# Patient Record
Sex: Male | Born: 1985 | Race: Black or African American | Hispanic: No | Marital: Single | Smoking: Current every day smoker
Health system: Southern US, Community
[De-identification: ages and names within clinical notes are randomized; demographics above are authoritative.]

## PROBLEM LIST (undated history)

## (undated) DIAGNOSIS — I10 Essential (primary) hypertension: Secondary | ICD-10-CM

## (undated) HISTORY — DX: Essential (primary) hypertension: I10

---

## 2011-04-17 ENCOUNTER — Emergency Department (HOSPITAL_BASED_OUTPATIENT_CLINIC_OR_DEPARTMENT_OTHER)
Admission: EM | Admit: 2011-04-17 | Discharge: 2011-04-17 | Disposition: A | Discharge status: Home / Self Care | Payer: Self-pay | Attending: Emergency Medicine | Admitting: Emergency Medicine

## 2011-04-17 DIAGNOSIS — Z202 Contact with and (suspected) exposure to infections with a predominantly sexual mode of transmission: Secondary | ICD-10-CM | POA: Insufficient documentation

## 2011-04-17 DIAGNOSIS — N342 Other urethritis: Secondary | ICD-10-CM | POA: Insufficient documentation

## 2011-04-17 DIAGNOSIS — F17200 Nicotine dependence, unspecified, uncomplicated: Secondary | ICD-10-CM | POA: Insufficient documentation

## 2011-04-17 MED ORDER — CEFTRIAXONE SODIUM 250 MG IJ SOLR
250.0000 mg | Freq: Once | INTRAMUSCULAR | Status: AC
  Administered 2011-04-17: 250 mg via INTRAMUSCULAR
  Filled 2011-04-17: qty 250

## 2011-04-17 MED ORDER — AZITHROMYCIN 1 G PO PACK
1.0000 g | PACK | Freq: Once | ORAL | Status: AC
  Administered 2011-04-17: 1 g via ORAL
  Filled 2011-04-17: qty 1

## 2011-04-17 NOTE — ED Notes (Signed)
Pt denies c/o-girlfriend recently tx for BV with cont'd d/c

## 2011-04-17 NOTE — ED Provider Notes (Signed)
History     CSN: [id number]  Arrival date & time 04/17/11  1803   First MD Initiated Contact with Patient 04/17/11 1917      Chief Complaint  Patient presents with  . Exposure to STD    (Consider location/radiation/quality/duration/timing/severity/associated sxs/prior treatment) Patient is a 25 y.o. male presenting with STD exposure. The history is provided by the patient. No language interpreter was used.  Exposure to STD This is a new problem. The current episode started today. The problem occurs constantly. The problem has been unchanged. The symptoms are aggravated by nothing. He has tried nothing for the symptoms.  Pt reports girlfriend has a discharge.  Pt request test for std  History reviewed. No pertinent past medical history.  History reviewed. No pertinent past surgical history.  No family history on file.  History  Substance Use Topics  . Smoking status: Current Everyday Smoker  . Smokeless tobacco: Not on file  . Alcohol Use: Yes      Review of Systems  Genitourinary: Negative for urgency, decreased urine volume, discharge, penile swelling and penile pain.  All other systems reviewed and are negative.    Allergies  Review of patient's allergies indicates not on file.  Home Medications  No current outpatient prescriptions on file.  BP 133/65  Pulse 88  Temp(Src) 98.6 F (37 C) (Oral)  Resp 16  Ht 6\' 1"  (1.854 m)  Wt 165 lb (74.844 kg)  BMI 21.77 kg/m2  SpO2 100%  Physical Exam  Nursing note and vitals reviewed. Constitutional: He appears well-developed and well-nourished.  HENT:  Head: Normocephalic.  Eyes: Pupils are equal, round, and reactive to light.  Neck: Normal range of motion.  Cardiovascular: Normal rate and normal heart sounds.   Pulmonary/Chest: Effort normal.  Abdominal: Soft.  Genitourinary: Penis normal. No penile tenderness.  Neurological: He is alert.  Skin: Skin is warm.    ED Course  Procedures (including critical  care time)   Labs Reviewed  GC/CHLAMYDIA PROBE AMP, GENITAL   No results found.   No diagnosis found.    MDM  Pt request treatment        Langston Masker, [place] 04/17/11 1919

## 2011-04-18 NOTE — ED Provider Notes (Signed)
Medical screening examination/treatment/procedure(s) were performed by non-physician practitioner and as supervising physician I was immediately available for consultation/collaboration.  Kenleigh Toback R. Paschal Blanton, MD 04/18/11 0044 

## 2011-04-19 LAB — GC/CHLAMYDIA PROBE AMP, GENITAL: Chlamydia, DNA Probe: NEGATIVE

## 2011-10-07 ENCOUNTER — Emergency Department (HOSPITAL_BASED_OUTPATIENT_CLINIC_OR_DEPARTMENT_OTHER)
Admission: EM | Admit: 2011-10-07 | Discharge: 2011-10-07 | Disposition: A | Discharge status: Home / Self Care | Payer: Self-pay | Attending: Emergency Medicine | Admitting: Emergency Medicine

## 2011-10-07 ENCOUNTER — Encounter (HOSPITAL_BASED_OUTPATIENT_CLINIC_OR_DEPARTMENT_OTHER): Payer: Self-pay | Admitting: *Deleted

## 2011-10-07 ENCOUNTER — Emergency Department (HOSPITAL_BASED_OUTPATIENT_CLINIC_OR_DEPARTMENT_OTHER): Payer: Self-pay

## 2011-10-07 DIAGNOSIS — F17200 Nicotine dependence, unspecified, uncomplicated: Secondary | ICD-10-CM | POA: Insufficient documentation

## 2011-10-07 DIAGNOSIS — Y998 Other external cause status: Secondary | ICD-10-CM | POA: Insufficient documentation

## 2011-10-07 DIAGNOSIS — W19XXXA Unspecified fall, initial encounter: Secondary | ICD-10-CM | POA: Insufficient documentation

## 2011-10-07 DIAGNOSIS — S62111A Displaced fracture of triquetrum [cuneiform] bone, right wrist, initial encounter for closed fracture: Secondary | ICD-10-CM

## 2011-10-07 DIAGNOSIS — S62113A Displaced fracture of triquetrum [cuneiform] bone, unspecified wrist, initial encounter for closed fracture: Secondary | ICD-10-CM | POA: Insufficient documentation

## 2011-10-07 DIAGNOSIS — Y9383 Activity, rough housing and horseplay: Secondary | ICD-10-CM | POA: Insufficient documentation

## 2011-10-07 MED ORDER — HYDROCODONE-ACETAMINOPHEN 5-500 MG PO TABS: 1.0000 | ORAL_TABLET | Freq: Four times a day (QID) | ORAL | Status: AC | PRN

## 2011-10-07 NOTE — Discharge Instructions (Signed)
Hand Fracture Your caregiver has diagnosed you with a fractured (broken) bone in your hand. If the bones are in good position and the hand is properly immobilized and rested, these injuries will usually heal in 3 to 6 weeks. A cast, splint, or bulky bandage is usually applied to keep the fracture site from moving. Do not remove the splint or cast until your caregiver approves. If the fracture is unstable or the bones are not aligned properly, surgery may be needed. Keep your hand raised (elevated) above the level of your heart as much as possible for the next 2 to 3 days until the swelling and pain are better. Apply ice packs for 15 to 20 minutes every 3 to 4 hours to help control the pain and swelling. See your caregiver or an orthopedic specialist as directed for follow-up care to make sure the fracture is beginning to heal properly. SEEK IMMEDIATE MEDICAL CARE IF:   You notice your fingers are cold, numb, crooked, or the pain of your injury is severe.   You are not improving or seem to be getting worse.   You have questions or concerns.  Document Released: 05/15/2004 Document Revised: 03/27/2011 Document Reviewed: 10/03/2008 Exit[employer] Patient Information 2012 [employer] 

## 2011-10-07 NOTE — ED Provider Notes (Addendum)
History     CSN: [id number]  Arrival date & time 10/07/11  1752   First MD Initiated Contact with Patient 10/07/11 1757      Chief Complaint  Patient presents with  . Hand Pain    (Consider location/radiation/quality/duration/timing/severity/associated sxs/prior treatment) HPI Comments: Pain is 7/10 and worse in the palm and lateral hand  Patient is a 26 y.o. male presenting with hand pain. The history is provided by the patient.  Hand Pain This is a new (was playing with the dog and fell on the concrete on his right hand) problem. The current episode started 1 to 2 hours ago. The problem occurs constantly. The problem has not changed since onset.The symptoms are aggravated by bending and twisting. Nothing relieves the symptoms. He has tried acetaminophen for the symptoms. The treatment provided no relief.    History reviewed. No pertinent past medical history.  History reviewed. No pertinent past surgical history.  History reviewed. No pertinent family history.  History  Substance Use Topics  . Smoking status: Current Everyday Smoker  . Smokeless tobacco: Not on file  . Alcohol Use: Yes      Review of Systems  Constitutional: Negative for fever and chills.  Neurological: Negative for weakness and numbness.  All other systems reviewed and are negative.    Allergies  Review of patient's allergies indicates no known allergies.  Home Medications  No current outpatient prescriptions on file.  BP 136/90  Pulse 76  Temp 98.3 F (36.8 C) (Oral)  Resp 20  Ht 6\' 2"  (1.88 m)  Wt 166 lb (75.297 kg)  BMI 21.31 kg/m2  SpO2 99%  Physical Exam  Nursing note and vitals reviewed. Constitutional: He is oriented to person, place, and time. He appears well-developed and well-nourished. No distress.  HENT:  Head: Normocephalic and atraumatic.  Mouth/Throat: Oropharynx is clear and moist.  Eyes: Conjunctivae and EOM are normal. Pupils are equal, round, and reactive to  light.  Musculoskeletal: He exhibits tenderness. He exhibits no edema.       Right elbow: Normal.      Right hand: He exhibits decreased range of motion, tenderness, bony tenderness and swelling. He exhibits normal capillary refill, no deformity and no laceration. normal sensation noted. Normal strength noted.       Hands:      Right snuffbox tenderness  Neurological: He is alert and oriented to person, place, and time.  Skin: Skin is warm and dry. No rash noted. No erythema.  Psychiatric: He has a normal mood and affect. His behavior is normal.    ED Course  Procedures (including critical care time)  Labs Reviewed - No data to display Dg Wrist Complete Right  10/07/2011  *RADIOLOGY REPORT*  Clinical Data: Fall  RIGHT WRIST - COMPLETE 3+ VIEW  Comparison: None.  Findings: On the lateral view, there is cortical irregularity dorsally.  This may be due to bony overlap or possibly a fracture of the triquetrum.  Clinical evaluation of this area suggested. This cannot be confirmed on other views.  No other fracture or arthropathy.  IMPRESSION: Possible fracture versus bony overlap of the dorsal wrist.  Original Report Authenticated By: Camelia Phenes, M.D.   Dg Hand Complete Right  10/07/2011  *RADIOLOGY REPORT*  Clinical Data: Fall  RIGHT HAND - COMPLETE 3+ VIEW  Comparison: None.  Findings: Three views of the right hand submitted.  No displaced fracture or subluxation.  On lateral view there is cortical step- off dorsal aspect of  the triquetrum.  I cannot exclude a triquetral fracture.  Clinical correlation is necessary.  IMPRESSION:  No displaced fracture or subluxation.  On lateral view there is cortical step-off dorsal aspect of the triquetrum.  I cannot exclude a triquetral fracture.  Clinical correlation is necessary.  Original Report Authenticated By: Natasha Mead, M.D.     1. Fracture of triquetrum of right wrist, closed       MDM   Patient with mechanical fall and persistent right hand  pain. He has swelling over his fourth and fifth MCP joint as well as tenderness in the snuffbox and palmar region of her of his hands. He is neurovascularly intact with normal tendon function and less than 2 second capillary refill. Plain films show a possible step off over the triquetrum.   Patient is tender in this area. Will place him in the splint and have him followup with hand       Gwyneth Sprout, MD 10/07/11 1904  Gwyneth Sprout, MD 10/07/11 5366

## 2011-10-07 NOTE — ED Notes (Signed)
Waiting on Thumb Spica Splint to be applied before discharging patient.

## 2011-10-07 NOTE — ED Notes (Signed)
Larey Seat on concrete landed on right hand and wrist hurting up to elbow

## 2013-03-28 IMAGING — CR DG WRIST COMPLETE 3+V*R*
4 series · 4 of 4 positions shown · non-contrast
Comparison: None.

CLINICAL DATA: Fall

RIGHT WRIST - COMPLETE 3+ VIEW

[x wrist pa right]
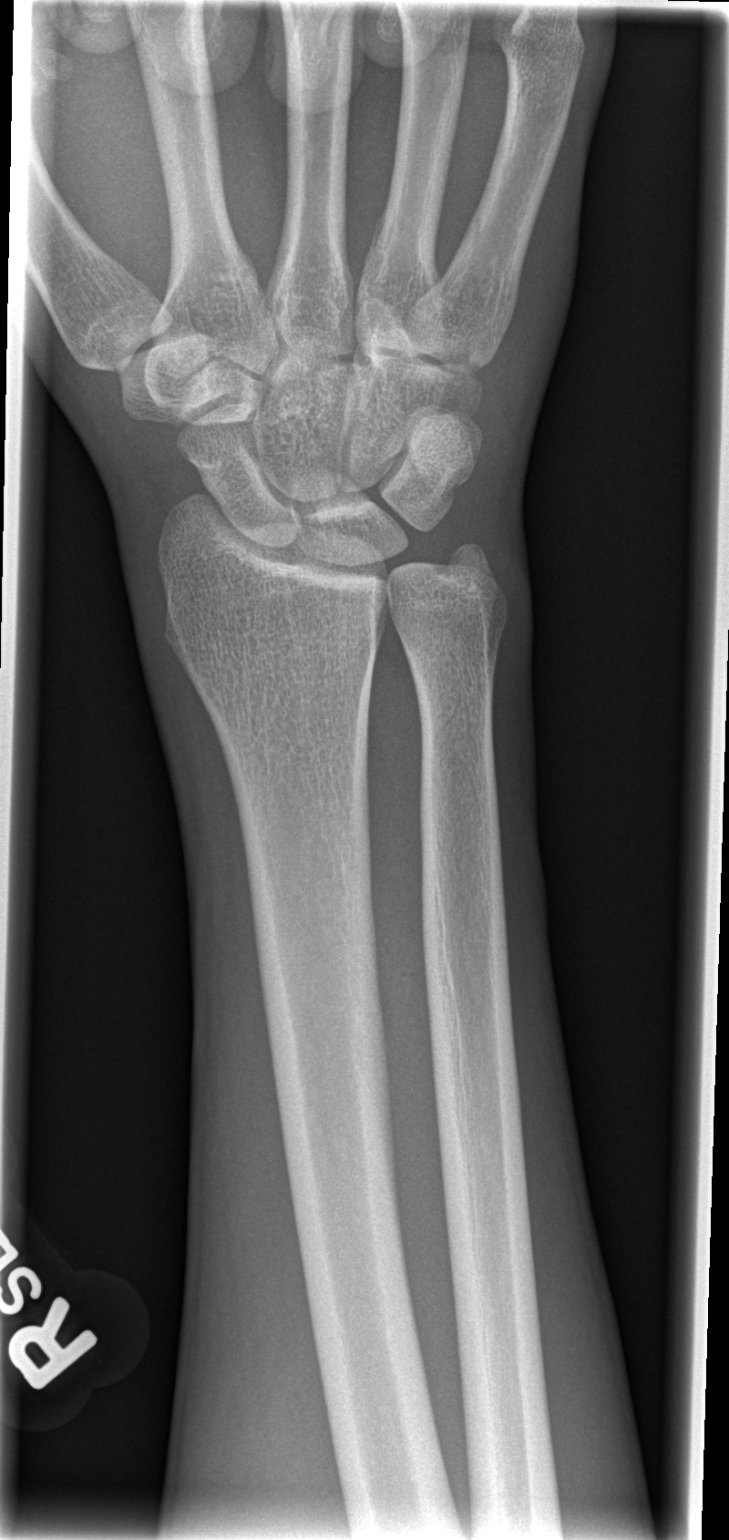

[x wrist obl right]
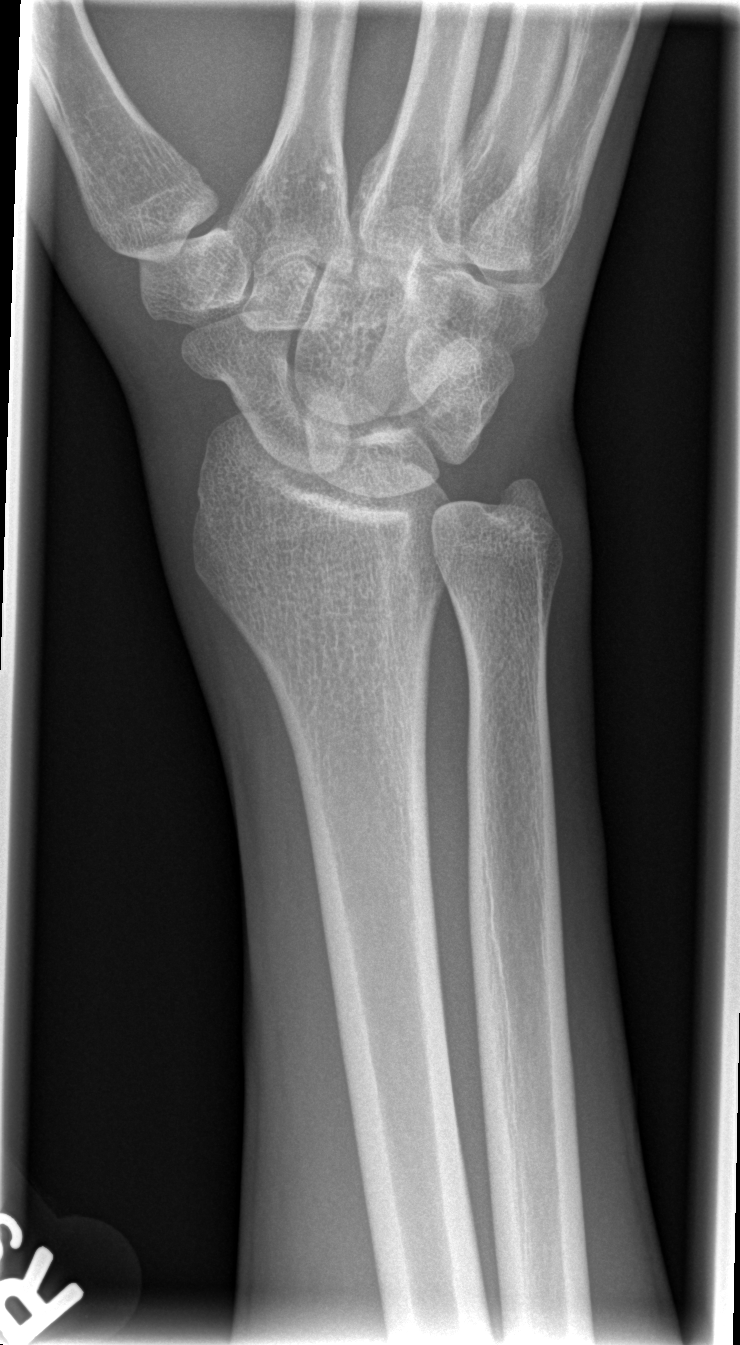

[x wrist lat right]
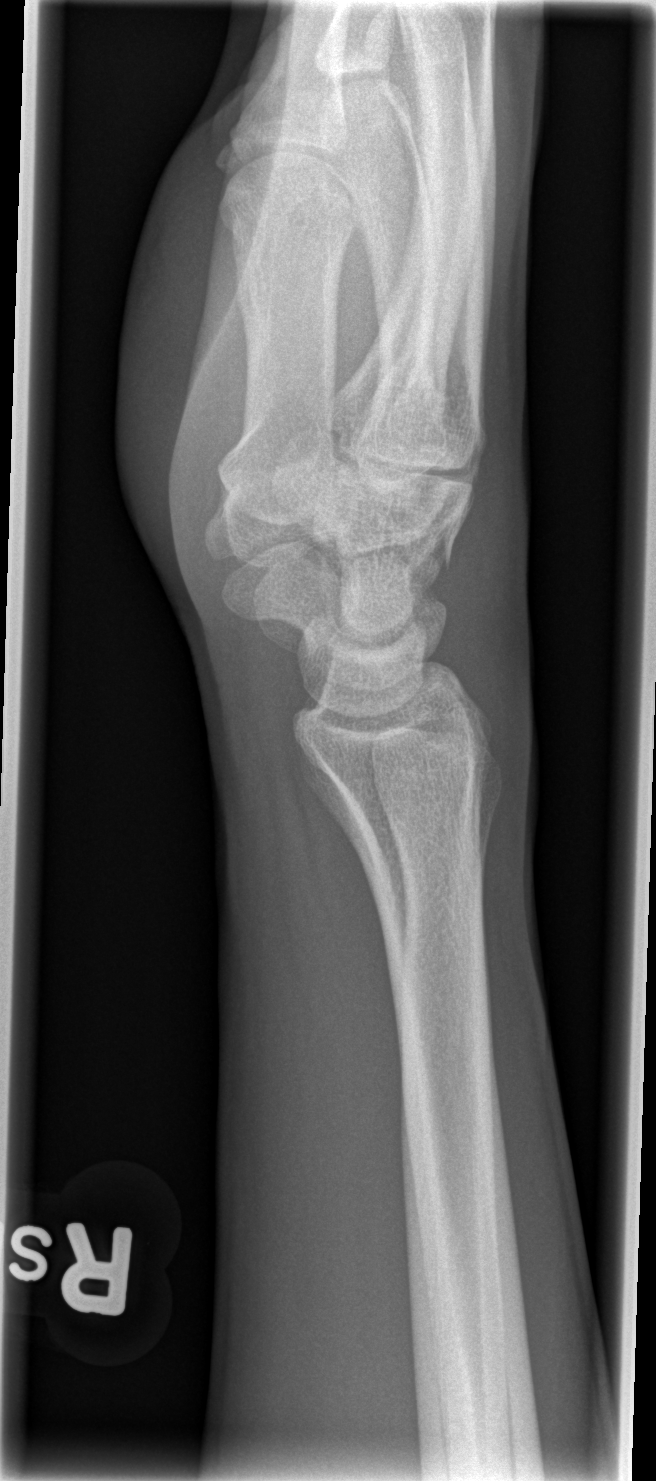

[x navicular]
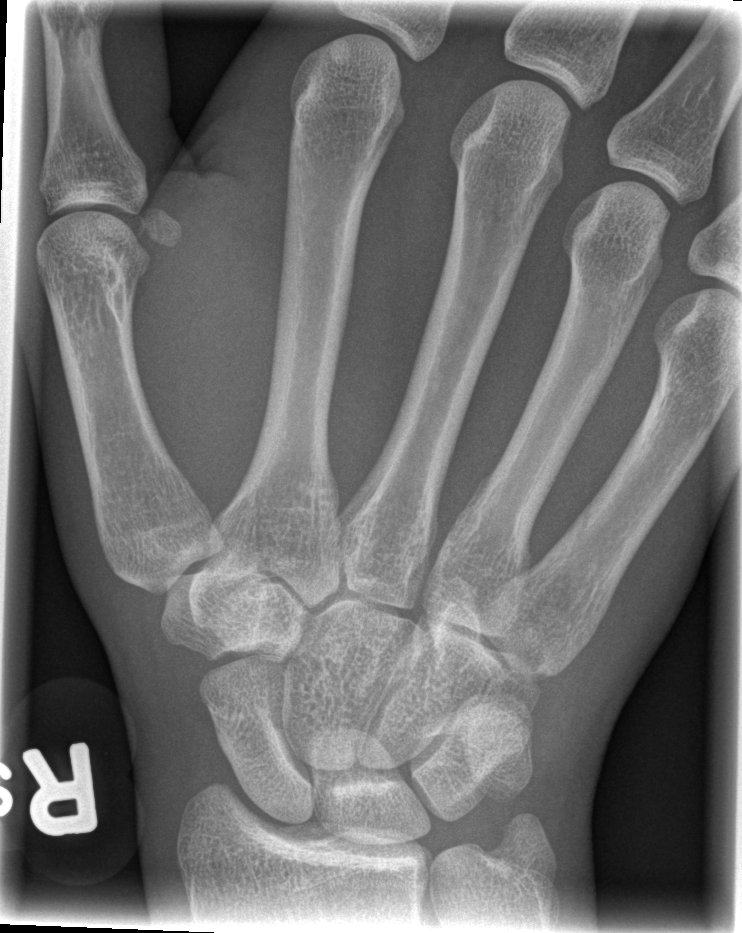

[4 of 4 positions shown; findings below may reference images not displayed]

FINDINGS: On the lateral view, there is cortical irregularity
dorsally.  This may be due to bony overlap or possibly a fracture
of the triquetrum.  Clinical evaluation of this area suggested.
This cannot be confirmed on other views.

No other fracture or arthropathy.
IMPRESSION: Possible fracture versus bony overlap of the dorsal wrist.

## 2013-03-28 IMAGING — CR DG HAND COMPLETE 3+V*R*
3 series · 3 of 3 positions shown · non-contrast
Comparison: None.

CLINICAL DATA: Fall

RIGHT HAND - COMPLETE 3+ VIEW

[x hand pa right]
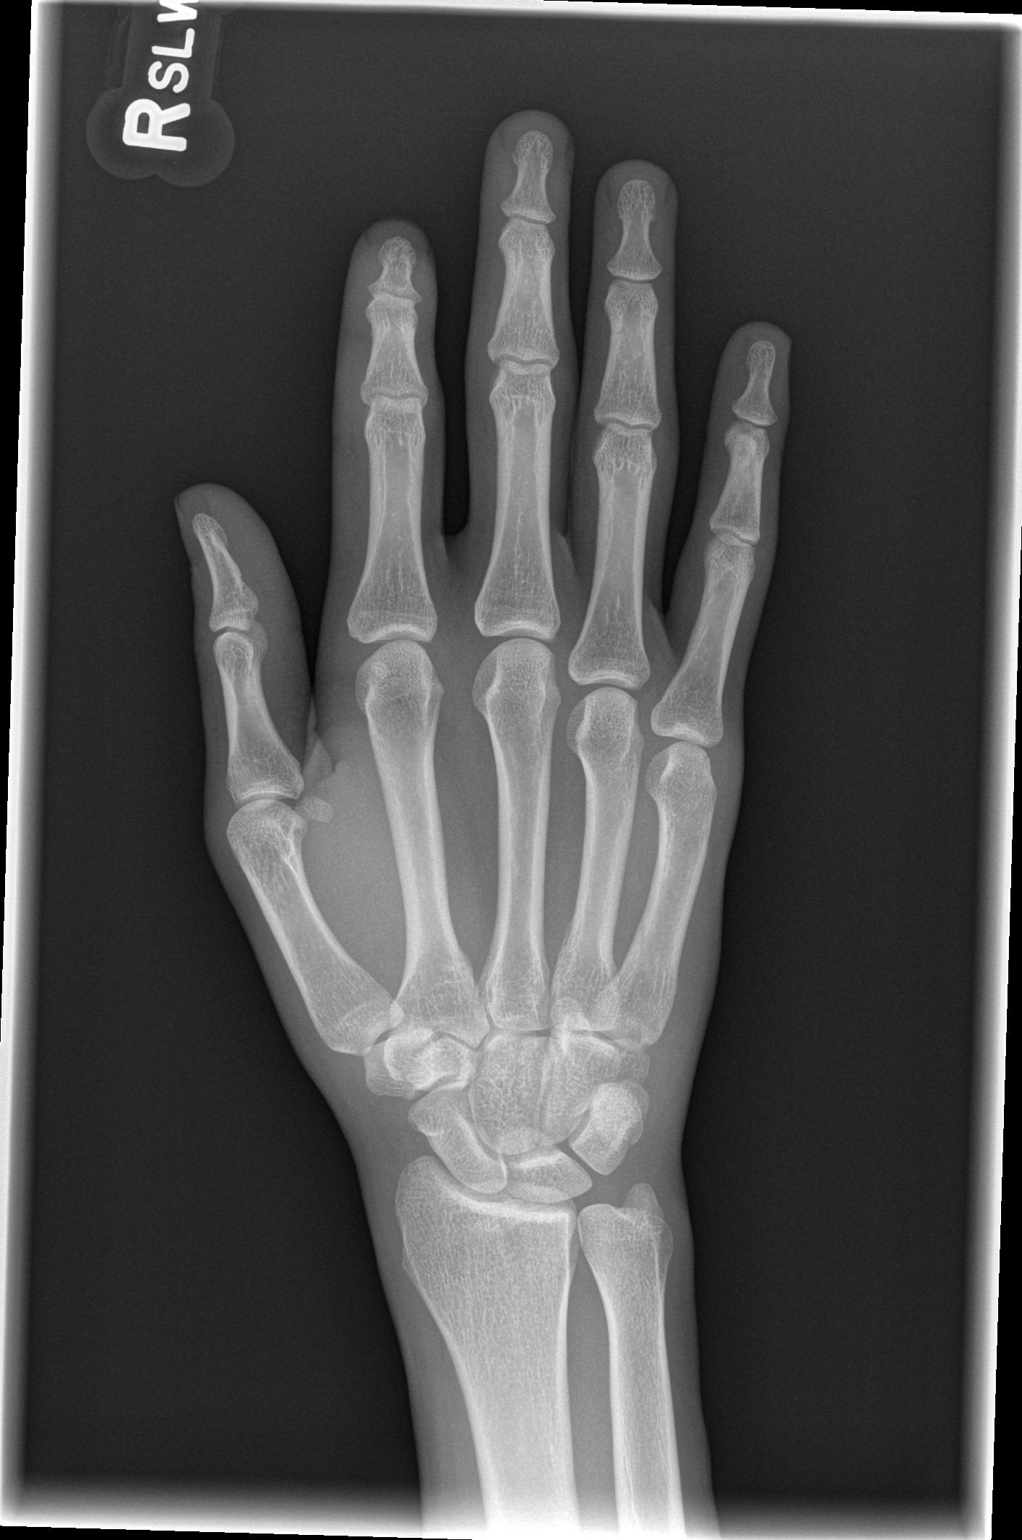

[x hand oblique right]
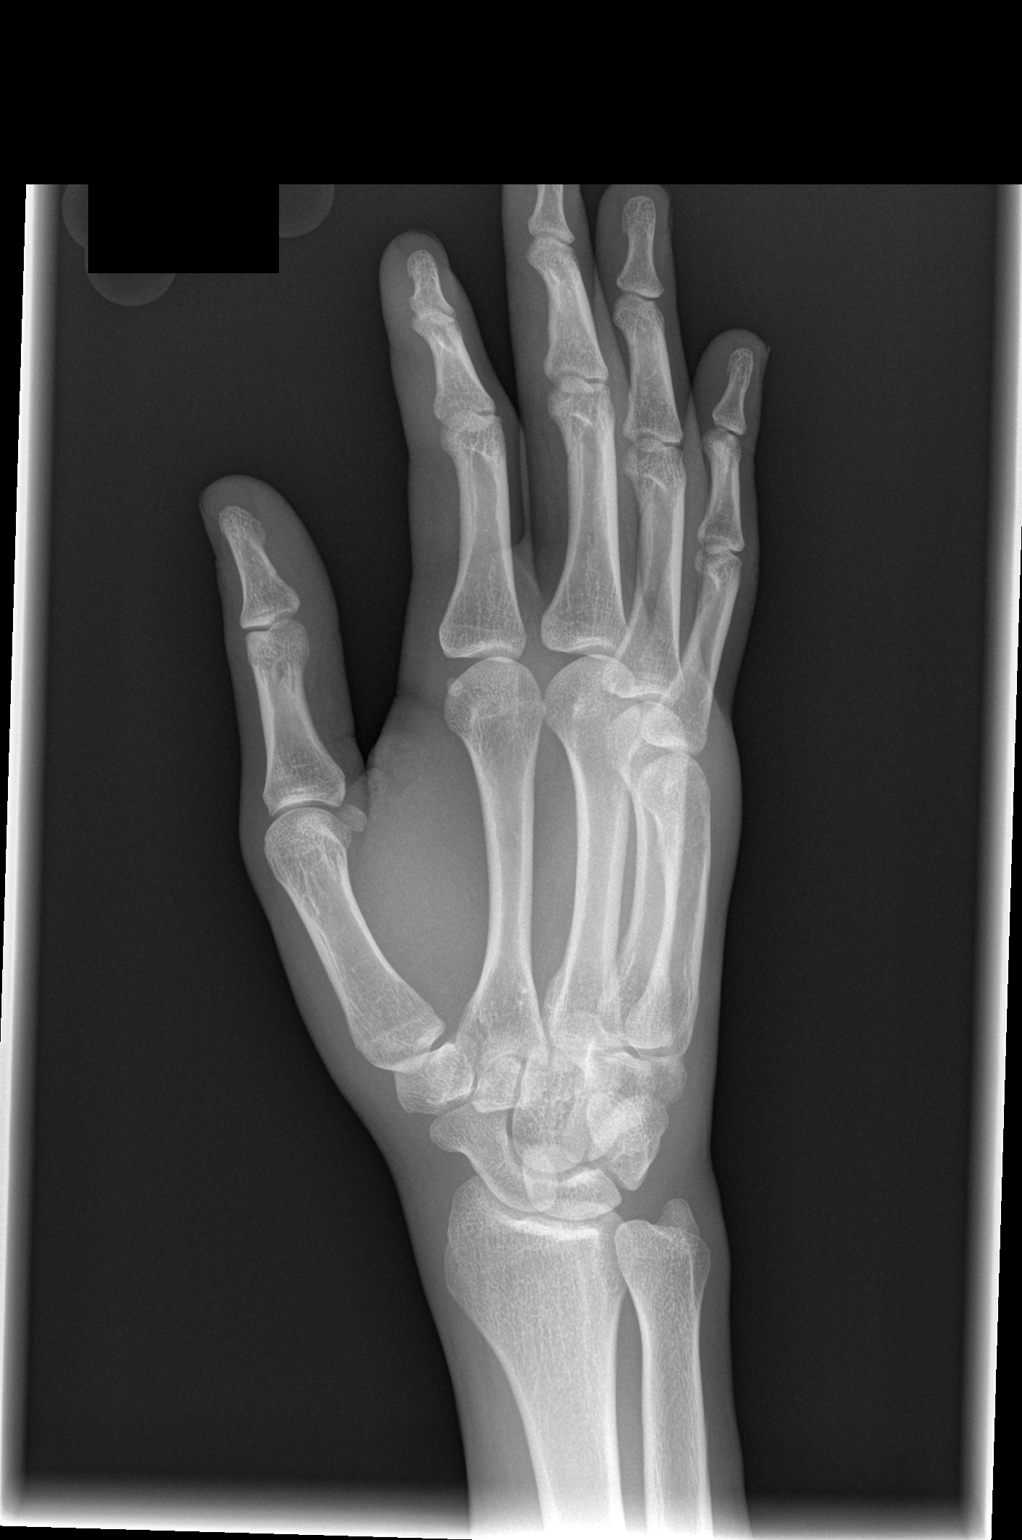

[x hand lat right]
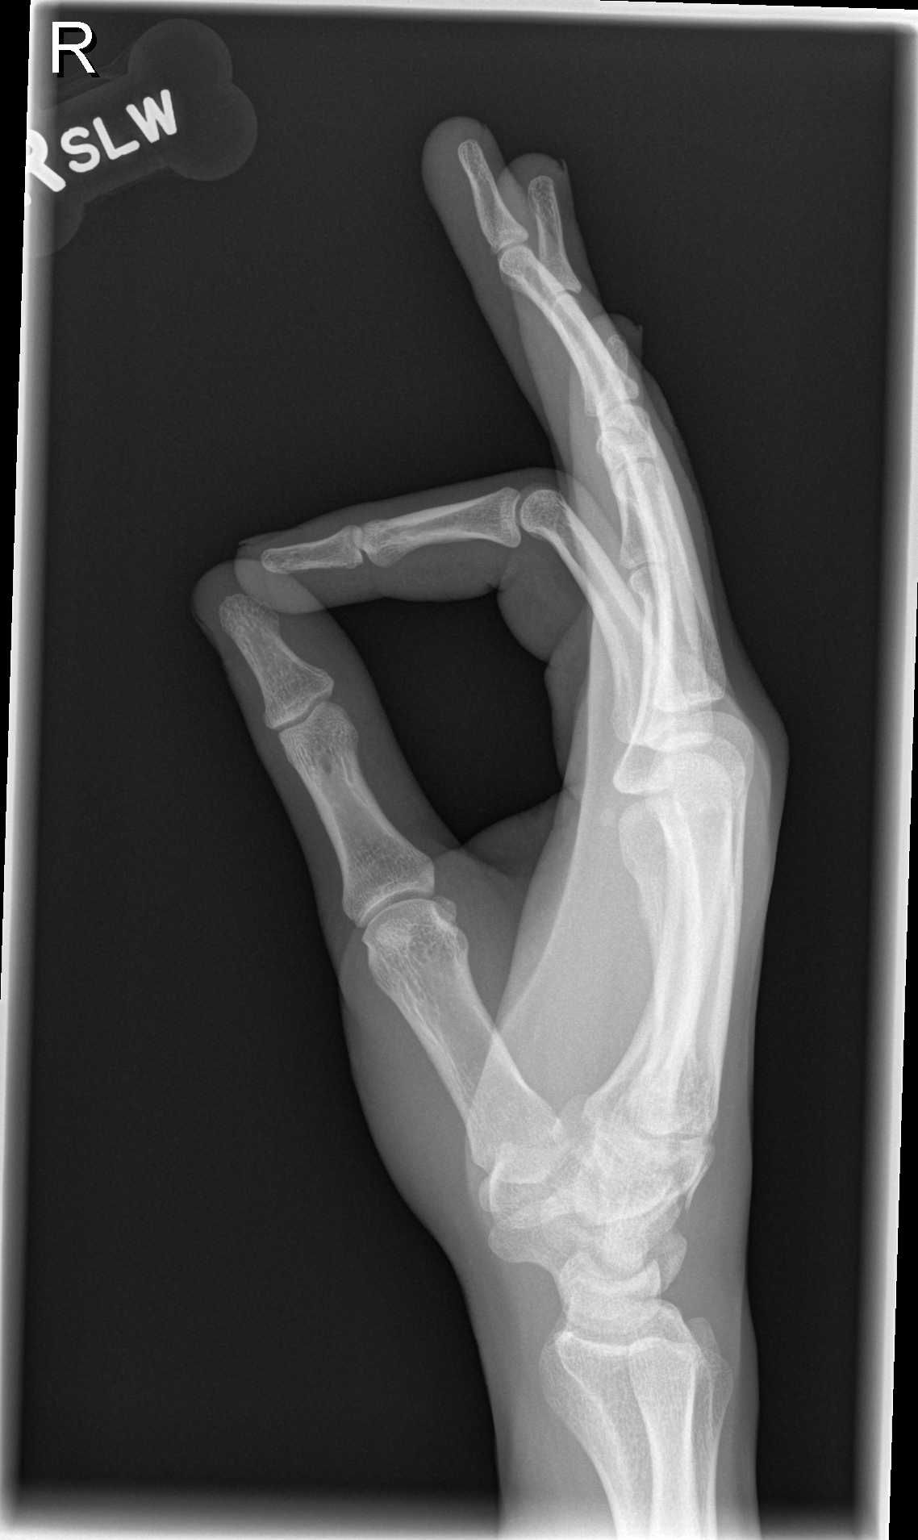

[3 of 3 positions shown; findings below may reference images not displayed]

FINDINGS: Three views of the right hand submitted.  No displaced
fracture or subluxation.  On lateral view there is cortical step-
off dorsal aspect of the triquetrum.  I cannot exclude a triquetral
fracture.  Clinical correlation is necessary.
IMPRESSION: No displaced fracture or subluxation.  On lateral view there is
cortical step-off dorsal aspect of the triquetrum.  I cannot
exclude a triquetral fracture.  Clinical correlation is necessary.]

## 2015-10-10 ENCOUNTER — Emergency Department (HOSPITAL_BASED_OUTPATIENT_CLINIC_OR_DEPARTMENT_OTHER)
Admission: EM | Admit: 2015-10-10 | Discharge: 2015-10-10 | Disposition: A | Discharge status: Home / Self Care | Payer: Self-pay | Attending: Emergency Medicine | Admitting: Emergency Medicine

## 2015-10-10 ENCOUNTER — Encounter (HOSPITAL_BASED_OUTPATIENT_CLINIC_OR_DEPARTMENT_OTHER): Payer: Self-pay

## 2015-10-10 DIAGNOSIS — Z7251 High risk heterosexual behavior: Secondary | ICD-10-CM

## 2015-10-10 DIAGNOSIS — Z113 Encounter for screening for infections with a predominantly sexual mode of transmission: Secondary | ICD-10-CM | POA: Insufficient documentation

## 2015-10-10 DIAGNOSIS — F17200 Nicotine dependence, unspecified, uncomplicated: Secondary | ICD-10-CM | POA: Insufficient documentation

## 2015-10-10 DIAGNOSIS — N4889 Other specified disorders of penis: Secondary | ICD-10-CM

## 2015-10-10 LAB — URINALYSIS, ROUTINE W REFLEX MICROSCOPIC
Bilirubin Urine: NEGATIVE
GLUCOSE, UA: NEGATIVE mg/dL
Hgb urine dipstick: NEGATIVE
KETONES UR: NEGATIVE mg/dL
NITRITE: NEGATIVE
PROTEIN: NEGATIVE mg/dL
Specific Gravity, Urine: 1.019 (ref 1.005–1.030)
pH: 6.5 (ref 5.0–8.0)

## 2015-10-10 LAB — URINE MICROSCOPIC-ADD ON

## 2015-10-10 MED ORDER — AZITHROMYCIN 250 MG PO TABS
1000.0000 mg | ORAL_TABLET | Freq: Once | ORAL | Status: AC
  Administered 2015-10-10: 1000 mg via ORAL
  Filled 2015-10-10: qty 4

## 2015-10-10 MED ORDER — CEFTRIAXONE SODIUM 250 MG IJ SOLR
250.0000 mg | Freq: Once | INTRAMUSCULAR | Status: AC
  Administered 2015-10-10: 250 mg via INTRAMUSCULAR
  Filled 2015-10-10: qty 250

## 2015-10-10 NOTE — ED Notes (Signed)
Patient here with penile itching and burning to urethra x 3 days, States that his urine looks dark as well

## 2015-10-10 NOTE — ED Provider Notes (Signed)
CSN: [id number]     Arrival date & time 10/10/15  1140 History   First MD Initiated Contact with Patient 10/10/15 1208     Chief Complaint  Patient presents with  . Dysuria     (Consider location/radiation/quality/duration/timing/severity/associated sxs/prior Treatment) HPI Comments: Victor Bennett is a 30 y.o. male who presents to the ED with complaints of intermittent penile "tingling" that has been ongoing 3 days with associated dark urine and increased urinary frequency. He DENIES penile itching or burning (as was noted in triage notes), dysuria, hematuria, or malodorous urine. He denies any penile swelling, testicular pain or swelling, or penile discharge. Additionally he denies any fevers, chills, chest pain, shortness breath, abdominal pain, nausea, vomiting, diarrhea, constipation, genital lesions, numbness, tingling, or focal weakness. He is sexually active with 2 male partners in the last year, both unprotected.  The history is provided by the patient. No language interpreter was used.    History reviewed. No pertinent past medical history. History reviewed. No pertinent past surgical history. No family history on file. Social History  Substance Use Topics  . Smoking status: Current Every Day Smoker  . Smokeless tobacco: None  . Alcohol Use: Yes    Review of Systems  Constitutional: Negative for fever and chills.  Respiratory: Negative for shortness of breath.   Cardiovascular: Negative for chest pain.  Gastrointestinal: Negative for nausea, vomiting, abdominal pain, diarrhea and constipation.  Genitourinary: Positive for frequency and penile pain ("tingling"). Negative for dysuria, hematuria, discharge, penile swelling, scrotal swelling, genital sores and testicular pain.       +dark urine No malodorous urine  Musculoskeletal: Negative for myalgias and arthralgias.  Skin: Negative for color change.  Allergic/Immunologic: Negative for immunocompromised state.   Neurological: Negative for weakness and numbness.  Psychiatric/Behavioral: Negative for confusion.   10 Systems reviewed and are negative for acute change except as noted in the HPI.    Allergies  Review of patient's allergies indicates no known allergies.  Home Medications   Prior to Admission medications   Not on File   BP 153/87 mmHg  Pulse 86  Temp(Src) 98.2 F (36.8 C) (Oral)  Resp 16  Ht 6' (1.829 m)  Wt 70.308 kg  BMI 21.02 kg/m2  SpO2 99% Physical Exam  Constitutional: He is oriented to person, place, and time. Vital signs are normal. He appears well-developed and well-nourished.  Non-toxic appearance. No distress.  Afebrile, nontoxic, NAD  HENT:  Head: Normocephalic and atraumatic.  Mouth/Throat: Oropharynx is clear and moist and mucous membranes are normal.  Eyes: Conjunctivae and EOM are normal. Right eye exhibits no discharge. Left eye exhibits no discharge.  Neck: Normal range of motion. Neck supple.  Cardiovascular: Normal rate, regular rhythm, normal heart sounds and intact distal pulses.  Exam reveals no gallop and no friction rub.   No murmur heard. Pulmonary/Chest: Effort normal and breath sounds normal. No respiratory distress. He has no decreased breath sounds. He has no wheezes. He has no rhonchi. He has no rales.  Abdominal: Soft. Normal appearance and bowel sounds are normal. He exhibits no distension. There is no tenderness. There is no rigidity, no rebound, no guarding, no CVA tenderness, no tenderness at McBurney's point and negative Murphy's sign. Hernia confirmed negative in the right inguinal area and confirmed negative in the left inguinal area.  Genitourinary: Testes normal. Cremasteric reflex is present. Right testis shows no mass, no swelling and no tenderness. Left testis shows no mass, no swelling and no tenderness. Circumcised. No phimosis,  paraphimosis, hypospadias, penile erythema or penile tenderness. No discharge found.  Chaperone present  for exam Circumcised penis without phimosis/paraphimosis, hypospadias, erythema, tenderness, or discharge. No rashes or lesions. Testes with no masses or tenderness, no swelling, and cremasterics reflex present bilaterally. No abnormal lie. No inguinal hernias or adenopathy present.   Musculoskeletal: Normal range of motion.  Neurological: He is alert and oriented to person, place, and time. He has normal strength. No sensory deficit.  Skin: Skin is warm, dry and intact. No rash noted.  Psychiatric: He has a normal mood and affect.  Nursing note and vitals reviewed.   ED Course  Procedures (including critical care time) Labs Review Labs Reviewed  URINALYSIS, ROUTINE W REFLEX MICROSCOPIC (NOT AT [facility]) - Abnormal; Notable for the following:    Leukocytes, UA SMALL (*)    All other components within normal limits  URINE MICROSCOPIC-ADD ON - Abnormal; Notable for the following:    Squamous Epithelial / LPF 0-5 (*)    Bacteria, UA RARE (*)    All other components within normal limits  URINE CULTURE  RPR  HIV ANTIBODY (ROUTINE TESTING)  GC/CHLAMYDIA PROBE AMP ([place]) NOT AT [facility]    Imaging Review No results found. I have personally reviewed and evaluated these images and lab results as part of my medical decision-making.   EKG Interpretation None      MDM   Final diagnoses:  Penile irritation  Screening for STD (sexually transmitted disease)  Unprotected sexual intercourse    30 y.o. male here with penile "tingling" intermittently, increased urinary frequency and dark urine. No dysuria or hematuria, no penile d/c or itching as was reported in triage note. Sexually active with 2 partners in the last year. GU exam benign, no discharge or concerning findings. U/A with 0-5 squamous, 0-5 WBC and RBCs, rare bacteria; likely contaminant, but will send for culture. Genprobe was performed on urethral swab but unfortunately the tech handling the specimen accidentally dropped it into  the trashcan. Will have repeat GC/CT test sent on the urine specimen instead. Will empirically tx for GC/CT, discussed abstaining from sex until results known. HIV/RPR testing done today. F/up with [facility] in 1-2wks to establish primary care and recheck symptoms. Doubt need for further work up at this time. I explained the diagnosis and have given explicit precautions to return to the ER including for any other new or worsening symptoms. The patient understands and accepts the medical plan as it's been dictated and I have answered their questions. Discharge instructions concerning home care and prescriptions have been given. The patient is STABLE and is discharged to home in good condition.   BP 153/87 mmHg  Pulse 86  Temp(Src) 98.2 F (36.8 C) (Oral)  Resp 16  Ht 6' (1.829 m)  Wt 70.308 kg  BMI 21.02 kg/m2  SpO2 99%  Meds ordered this encounter  Medications  . azithromycin (ZITHROMAX) tablet 1,000 mg    Sig:    And  . cefTRIAXone (ROCEPHIN) injection 250 mg    Sig:     Order Specific Question:  Antibiotic Indication:    Answer:  STD     Londen Lorge Camprubi-Soms, PA-C 10/10/15 1249  Zadie Rhine, MD 10/10/15 1605

## 2015-10-10 NOTE — Discharge Instructions (Signed)
You have been treated for gonorrhea and chlamydia in the ER but the hospital will call you if lab is positive. You were tested for HIV and Syphilis, and the hospital will call you if the lab is positive. DO NOT ENGAGE IN SEXUAL ACTIVITY UNTIL YOU FIND OUT ABOUT YOUR RESULTS, THIS WILL INVALIDATE YOUR TREATMENT HERE IF YOU ENGAGE IN SEX BEFORE KNOWING YOUR RESULTS AND HAVING PARTNERS TESTED AND TREATED. ALL PARTNERS MUST BE TESTED AND TREATED FOR STD'S. NO SEXUAL INTERCOURSE FOR AT LEAST 10 DAYS AFTER TODAY'S VISIT. ALWAYS USE CONDOMS WHEN ENGAGING IN INTERCOURSE. Stay well hydrated with plenty of water. [facility] for future STD concerns or screenings. This is the recommendation by the CDC for people with multiple sexual partners or history of STDs. Follow up with  and wellness clinic in 1-2 weeks for recheck of symptoms and to establish primary care. Return to the ER for changes or worsening symptoms.     Safe Sex Safe sex is about reducing the risk of giving or getting a sexually transmitted disease (STD). STDs are spread through sexual contact involving the genitals, mouth, or rectum. Some STDs can be cured and others cannot. Safe sex can also prevent unintended pregnancies.  WHAT ARE SOME SAFE SEX PRACTICES?  Limit your sexual activity to only one partner who is having sex with only you.  Talk to your partner about his or her past partners, past STDs, and drug use.  Use a condom every time you have sexual intercourse. This includes vaginal, oral, and anal sexual activity. Both females and males should wear condoms during oral sex. Only use latex or polyurethane condoms and water-based lubricants. Using petroleum-based lubricants or oils to lubricate a condom will weaken the condom and increase the chance that it will break. The condom should be in place from the beginning to the end of sexual activity. Wearing a condom reduces, but does not  completely eliminate, your risk of getting or giving an STD. STDs can be spread by contact with infected body fluids and skin.  Get vaccinated for hepatitis B and HPV.  Avoid alcohol and recreational drugs, which can affect your judgment. You may forget to use a condom or participate in high-risk sex.  For females, avoid douching after sexual intercourse. Douching can spread an infection farther into the reproductive tract.  Check your body for signs of sores, blisters, rashes, or unusual discharge. See your health care provider if you notice any of these signs.  Avoid sexual contact if you have symptoms of an infection or are being treated for an STD. If you or your partner has herpes, avoid sexual contact when blisters are present. Use condoms at all other times.  If you are at risk of being infected with HIV, it is recommended that you take a prescription medicine daily to prevent HIV infection. This is called pre-exposure prophylaxis (PrEP). You are considered at risk if:  You are a man who has sex with other men (MSM).  You are a heterosexual man or woman who is sexually active with more than one partner.  You take drugs by injection.  You are sexually active with a partner who has HIV.  Talk with your health care provider about whether you are at high risk of being infected with HIV. If you choose to begin PrEP, you should first be tested for HIV. You should then be tested every 3 months for as long as you are  taking PrEP.  See your health care provider for regular screenings, exams, and tests for other STDs. Before having sex with a new partner, each of you should be screened for STDs and should talk about the results with each other. WHAT ARE THE BENEFITS OF SAFE SEX?   There is less chance of getting or giving an STD.  You can prevent unwanted or unintended pregnancies.  By discussing safe sex concerns with your partner, you may increase feelings of intimacy, comfort, trust,  and honesty between the two of you.   This information is not intended to replace advice given to you by your health care provider. Make sure you discuss any questions you have with your health care provider.   Document Released: 05/15/2004 Document Revised: 04/28/2014 Document Reviewed: 09/29/2011 Elsevier Interactive Patient Education [employer]

## 2015-10-11 LAB — URINE CULTURE: Culture: NO GROWTH

## 2015-10-11 LAB — GC/CHLAMYDIA PROBE AMP (~~LOC~~) NOT AT ARMC
Chlamydia: NEGATIVE
NEISSERIA GONORRHEA: NEGATIVE

## 2015-10-11 LAB — RPR: RPR: NONREACTIVE

## 2015-10-11 LAB — HIV ANTIBODY (ROUTINE TESTING W REFLEX): HIV SCREEN 4TH GENERATION: NONREACTIVE

## 2019-11-26 ENCOUNTER — Emergency Department (INDEPENDENT_AMBULATORY_CARE_PROVIDER_SITE_OTHER)
Admission: EM | Admit: 2019-11-26 | Discharge: 2019-11-26 | Disposition: A | Discharge status: Home / Self Care | Payer: Self-pay | Source: Home / Self Care

## 2019-11-26 ENCOUNTER — Encounter: Payer: Self-pay | Admitting: Emergency Medicine

## 2019-11-26 ENCOUNTER — Other Ambulatory Visit: Payer: Self-pay

## 2019-11-26 DIAGNOSIS — R030 Elevated blood-pressure reading, without diagnosis of hypertension: Secondary | ICD-10-CM

## 2019-11-26 DIAGNOSIS — S61212A Laceration without foreign body of right middle finger without damage to nail, initial encounter: Secondary | ICD-10-CM

## 2019-11-26 NOTE — ED Provider Notes (Addendum)
[facility]    CSN: [id number] Arrival date & time: 11/26/19  1539      History   Chief Complaint Chief Complaint  Patient presents with  . Laceration    HPI Victor Bennett is a 34 y.o. male.   HPI  Victor Bennett is a 34 y.o. male presenting to UC with c/o laceration to his Right middle finger just PTA, cut it on a desk fan while helping his sister move into a new apartment. Pain is minimal. Bleeding controlled with light pressure. Last tetanus 3 years ago.   BP elevated in triage. Hx of HTN. Reports not taking his BP medication for a few days, does not like taking medication. Denies HA, dizziness or chest pain.  Past Medical History:  Diagnosis Date  . Hypertension     There are no problems to display for this patient.   History reviewed. No pertinent surgical history.     Home Medications    Prior to Admission medications   Not on File    Family History No family history on file.  Social History Social History   Tobacco Use  . Smoking status: Current Every Day Smoker  . Smokeless tobacco: Never Used  Substance Use Topics  . Alcohol use: Yes  . Drug use: Yes    Types: Marijuana     Allergies   Patient has no known allergies.   Review of Systems Review of Systems  Musculoskeletal: Negative for arthralgias and joint swelling.  Skin: Positive for wound. Negative for color change.  Neurological: Negative for weakness and numbness.     Physical Exam Triage Vital Signs ED Triage Vitals  Enc Vitals Group     BP 11/26/19 1553 (!) 159/109     Pulse Rate 11/26/19 1553 80     Resp --      Temp --      Temp src --      SpO2 11/26/19 1553 99 %     Weight --      Height --      Head Circumference --      Peak Flow --      Pain Score 11/26/19 1554 3     Pain Loc --      Pain Edu? --      Excl. in GC? --    No data found.  Updated Vital Signs BP (!) 155/104 (BP Location: Left Arm)   Pulse 65   SpO2 99%   Visual Acuity Right  Eye Distance:   Left Eye Distance:   Bilateral Distance:    Right Eye Near:   Left Eye Near:    Bilateral Near:     Physical Exam Vitals and nursing note reviewed.  Constitutional:      Appearance: He is well-developed.  HENT:     Head: Normocephalic and atraumatic.  Cardiovascular:     Rate and Rhythm: Normal rate.  Pulmonary:     Effort: Pulmonary effort is normal.  Musculoskeletal:        General: No swelling or tenderness. Normal range of motion.     Cervical back: Normal range of motion.  Skin:    General: Skin is warm and dry.     Comments: Right middle finger, distal phalanx volar aspect: 2cm clean linear laceration, oozing blood. Easily controlled with light pressure. No foreign bodies.   Neurological:     Mental Status: He is alert and oriented to person, place, and time.  Psychiatric:  Behavior: Behavior normal.      UC Treatments / Results  Labs (all labs ordered are listed, but only abnormal results are displayed) Labs Reviewed - No data to display  EKG   Radiology No results found.  Procedures Laceration Repair  Date/Time: 11/27/2019 11:25 AM Performed by: Lurene Shadow, PA-C Authorized by: Lurene Shadow, PA-C   Consent:    Consent obtained:  Verbal   Consent given by:  Patient   Risks discussed:  Infection, poor cosmetic result, pain, need for additional repair, poor wound healing and nerve damage   Alternatives discussed:  Delayed treatment and no treatment Anesthesia (see MAR for exact dosages):    Anesthesia method:  None Laceration details:    Location:  Finger   Finger location:  R long finger   Length (cm):  2   Depth (mm):  2 Repair type:    Repair type:  Simple Pre-procedure details:    Preparation:  Patient was prepped and draped in usual sterile fashion Exploration:    Hemostasis achieved with:  Direct pressure   Wound exploration: wound explored through full range of motion and entire depth of wound probed and  visualized     Wound extent: no areolar tissue violation noted, no fascia violation noted, no foreign bodies/material noted, no muscle damage noted, no nerve damage noted, no tendon damage noted, no underlying fracture noted and no vascular damage noted     Contaminated: no   Treatment:    Area cleansed with:  Saline   Amount of cleaning:  Standard Skin repair:    Repair method:  Steri-Strips   Number of Steri-Strips:  4 Approximation:    Approximation:  Close Post-procedure details:    Dressing:  Bulky dressing   Patient tolerance of procedure:  Tolerated well, no immediate complications   (including critical care time)  Medications Ordered in UC Medications - No data to display  Initial Impression / Assessment and Plan / UC Course  I have reviewed the triage vital signs and the nursing notes.  Pertinent labs & imaging results that were available during my care of the patient were reviewed by me and considered in my medical decision making (see chart for details).     Discussed sutures vs steri-strips as wound does not cross joint space and no adipose tissue exposed. Pt prefers steri-strips and bulky dressing Discussed home care  AVS given  BP elevated, encouraged to take his BP medication as prescribed. F/u with PCP as needed for BP.  Final Clinical Impressions(s) / UC Diagnoses   Final diagnoses:  Laceration of right middle finger without foreign body without damage to nail, initial encounter  Elevated blood pressure reading     Discharge Instructions      Keep pressure bandage in place for 2 days, then gently remove and replace with a clean dry bandage. Do NOT apply ointment or cream and do not soak your finger, this will cause the steri-strips to come off too soon.  Follow up in 1 week if not improving, sooner if concern for infection- increased pain, redness, drainage of pus.     ED Prescriptions    None     PDMP not reviewed this encounter.     Lurene Shadow, [place] 11/27/19 1128

## 2019-11-26 NOTE — Discharge Instructions (Addendum)
  Keep pressure bandage in place for 2 days, then gently remove and replace with a clean dry bandage. Do NOT apply ointment or cream and do not soak your finger, this will cause the steri-strips to come off too soon.  Follow up in 1 week if not improving, sooner if concern for infection- increased pain, redness, drainage of pus.

## 2019-11-26 NOTE — ED Triage Notes (Signed)
Patient was moving today, cut his right 3rd finger, possibly on a fan, unsure.  Last Td 3 years ago.
